# Patient Record
Sex: Female | Born: 1955 | Race: White | Hispanic: No | Marital: Married | Smoking: Never smoker
Health system: Southern US, Community
[De-identification: ages and names within clinical notes are randomized; demographics above are authoritative.]

## PROBLEM LIST (undated history)

## (undated) DIAGNOSIS — J45909 Unspecified asthma, uncomplicated: Secondary | ICD-10-CM

## (undated) HISTORY — PX: VESICOVAGINAL FISTULA CLOSURE W/ TAH: SUR271

## (undated) HISTORY — DX: Unspecified asthma, uncomplicated: J45.909

---

## 2014-03-27 ENCOUNTER — Ambulatory Visit (INDEPENDENT_AMBULATORY_CARE_PROVIDER_SITE_OTHER): Payer: BLUE CROSS/BLUE SHIELD | Admitting: Internal Medicine

## 2014-03-27 ENCOUNTER — Ambulatory Visit (INDEPENDENT_AMBULATORY_CARE_PROVIDER_SITE_OTHER)
Admission: RE | Admit: 2014-03-27 | Discharge: 2014-03-27 | Disposition: A | Discharge status: Home / Self Care | Payer: BLUE CROSS/BLUE SHIELD | Source: Ambulatory Visit | Attending: Internal Medicine | Admitting: Internal Medicine

## 2014-03-27 ENCOUNTER — Encounter (INDEPENDENT_AMBULATORY_CARE_PROVIDER_SITE_OTHER): Payer: Self-pay

## 2014-03-27 ENCOUNTER — Encounter: Payer: Self-pay | Admitting: Internal Medicine

## 2014-03-27 ENCOUNTER — Other Ambulatory Visit (INDEPENDENT_AMBULATORY_CARE_PROVIDER_SITE_OTHER): Payer: BLUE CROSS/BLUE SHIELD

## 2014-03-27 VITALS — BP 130/90 | HR 85 | Ht 63.0 in | Wt 193.0 lb

## 2014-03-27 DIAGNOSIS — R058 Other specified cough: Secondary | ICD-10-CM | POA: Insufficient documentation

## 2014-03-27 DIAGNOSIS — R05 Cough: Secondary | ICD-10-CM

## 2014-03-27 LAB — CBC WITH DIFFERENTIAL/PLATELET
Basophils Absolute: 0 10*3/uL (ref 0.0–0.1)
Basophils Relative: 0.4 % (ref 0.0–3.0)
Eosinophils Absolute: 0.3 10*3/uL (ref 0.0–0.7)
Eosinophils Relative: 2.7 % (ref 0.0–5.0)
HCT: 41 % (ref 36.0–46.0)
Hemoglobin: 13.6 g/dL (ref 12.0–15.0)
LYMPHS PCT: 14.5 % (ref 12.0–46.0)
Lymphs Abs: 1.4 10*3/uL (ref 0.7–4.0)
MCHC: 33.3 g/dL (ref 30.0–36.0)
MCV: 89.4 fl (ref 78.0–100.0)
Monocytes Absolute: 0.6 10*3/uL (ref 0.1–1.0)
Monocytes Relative: 5.9 % (ref 3.0–12.0)
NEUTROS ABS: 7.3 10*3/uL (ref 1.4–7.7)
NEUTROS PCT: 76.5 % (ref 43.0–77.0)
PLATELETS: 377 10*3/uL (ref 150.0–400.0)
RBC: 4.58 Mil/uL (ref 3.87–5.11)
RDW: 15.6 % — AB (ref 11.5–15.5)
WBC: 9.6 10*3/uL (ref 4.0–10.5)

## 2014-03-27 MED ORDER — TRAMADOL HCL 50 MG PO TABS: ORAL_TABLET | ORAL | Status: DC

## 2014-03-27 MED ORDER — PREDNISONE 10 MG PO TABS: ORAL_TABLET | ORAL | Status: DC

## 2014-03-27 MED ORDER — LEVOFLOXACIN 500 MG PO TABS: 500.0000 mg | ORAL_TABLET | Freq: Every day | ORAL | Status: DC

## 2014-03-27 NOTE — Patient Instructions (Addendum)
Please remember to go to the lab and x-ray department downstairs for your tests - we will call you with the results when they are available.    Only use your albuterol(proair)  as a rescue medication to be used if you can't catch your breath by resting or doing a relaxed purse lip breathing pattern.  - The less you use it, the better it will work when you need it. - Ok to use up to 2 puffs  every 4 hours if you must but call for immediate appointment if use goes up over your usual need - Don't leave home without it !!  (think of it like the spare tire for your car)   Levaquin 500 mg x 7days should get rid of the green mucus  Prednisone 10 mg take  4 each am x 2 days,   2 each am x 2 days,  1 each am x 2 days and stop   Please see patient coordinator before you leave today  to schedule sinus CT   Try prilosec 20mg  x 2 Take 30- 60 min before your first and last meals of the day   For cough > mucinex 1200 mg every 12 hours as needed> if can't stop coughing take tramadol 50 mg 1 every 4 hours but this may make your drowsy  GERD (REFLUX)  is an extremely common cause of respiratory symptoms, many times with no significant heartburn at all.    It can be treated with medication, but also with lifestyle changes including avoidance of late meals, excessive alcohol, smoking cessation, and avoid fatty foods, chocolate, peppermint, colas, red wine, and acidic juices such as orange juice.  NO MINT OR MENTHOL PRODUCTS SO NO COUGH DROPS  USE HARD CANDY INSTEAD (jolley ranchers or Stover's or Lifesavers (all available in sugarless versions) NO OIL BASED VITAMINS - use powdered substitutes.  Please schedule a follow up office visit in 2 weeks, sooner if needed

## 2014-03-27 NOTE — Assessment & Plan Note (Addendum)
The most common causes of chronic cough in immunocompetent adults include the following: upper airway cough syndrome (UACS), previously referred to as postnasal drip syndrome (PNDS), which is caused by variety of rhinosinus conditions; (2) asthma; (3) GERD; (4) chronic bronchitis from cigarette smoking or other inhaled environmental irritants; (5) nonasthmatic eosinophilic bronchitis; and (6) bronchiectasis.   These conditions, singly or in combination, have accounted for up to 94% of the causes of chronic cough in prospective studies.   Other conditions have constituted no >6% of the causes in prospective studies These have included bronchogenic carcinoma, chronic interstitial pneumonia, sarcoidosis, left ventricular failure, ACEI-induced cough, and aspiration from a condition associated with pharyngeal dysfunction.    Chronic cough is often simultaneously caused by more than one condition. A single cause has been found from 38 to 82% of the time, multiple causes from 18 to 62%. Multiply caused cough has been the result of three diseases up to 42% of the time.       Based on hx and exam, this is most likely:  Classic Upper airway cough syndrome, so named because it's frequently impossible to sort out how much is  CR/sinusitis with freq throat clearing (which can be related to primary GERD)   vs  causing  secondary (" extra esophageal")  GERD from wide swings in gastric pressure that occur with throat clearing, often  promoting self use of mint and menthol lozenges that reduce the lower esophageal sphincter tone and exacerbate the problem further in a cyclical fashion.   These are the same pts (now being labeled as having "irritable larynx syndrome" by some cough centers) who not infrequently have a history of having failed to tolerate ace inhibitors,  dry powder ICS(or even as in this case sometimes worse on hfa ICS) or biphosphonates or report having atypical reflux symptoms that don't respond to  standard doses of PPI , and are easily confused as having aecopd or asthma flares by even experienced allergists/ pulmonologists.   The first step is to maximize acid suppression and eliminate cyclical coughing/ stop all ics for now  And evaluate for sinus/allergic dz as possible underlying triggers  See instructions for specific recommendations which were reviewed directly with the patient who was given a copy with highlighter outlining the key components. .Marland Kitchen

## 2014-03-27 NOTE — Progress Notes (Signed)
Subjective:    Patient ID: Cheryl Ross, female    DOB: 1955-08-18,    MRN: [id number]  HPI  [age] yowf with ulcerative colitis  never smoker with "seasonal bronchitis" as child worse in middle school and high school esp Sep and April eval by Dr Jethro BolusGene [place] with allergies to pollen/dust/ grass/ dogs and no response to shots no maintenance rx just prn proair then starting Oct 2015 acute onset severe sore throat, head/ chest congestion / cough/ wheeze better maybe 75% while on prednisone and abx and relapse off so referred 03/27/2014 by Dr Abner GreenspanBeth Hodges to pulmonary clinic.   03/27/2014 [facility] office visit/ [facility]   Chief Complaint  Patient presents with  . Pulmonary Consult    Referred by Dr. Abner GreenspanBeth Hodges.  Pt states that she was dxed with asthma several years ago.  She c/o increased SOB and cough for the past 3 months. She is coughing up moderate green sputum.  She is mainly SOB with exertion "at a fairly brisk walk" but occ gets SOB at rest.   last abx Feb 03 2014 / last pred also then.  Present pattern of daily cough x 4 m>. Cough worst p stirs in am / and hs but not while sleeping or waking up prematurely No inhalers for sev days and no worse off them  but still taking singulair q hs since the fall of 2015 ? Helping (not sure ) Sore throat is gone now , no fever.  No obvious day to day or daytime variabilty or assoc cp or chest tightness, subjective wheeze overt sinus or hb symptoms. No unusual exp hx or h/o childhood pna/   knowledge of premature birth.  Sleeping ok without nocturnal  or early am exacerbation  of respiratory  c/o's or need for noct saba. Also denies any obvious fluctuation of symptoms with weather or environmental changes or other aggravating or alleviating factors except as outlined above   Current Medications, Allergies, Complete Past Medical History, Past Surgical History, Family History, and Social History were reviewed in [facility] electronic medical  record.       Review of Systems  Constitutional: Positive for appetite change. Negative for fever, chills and unexpected weight change.  HENT: Positive for congestion and sore throat. Negative for dental problem, ear pain, nosebleeds, postnasal drip, rhinorrhea, sinus pressure, sneezing, trouble swallowing and voice change.   Eyes: Negative for visual disturbance.  Respiratory: Positive for cough and shortness of breath. Negative for choking.   Cardiovascular: Negative for chest pain and leg swelling.  Gastrointestinal: Negative for vomiting, abdominal pain and diarrhea.  Genitourinary: Negative for difficulty urinating.  Musculoskeletal: Positive for arthralgias.  Skin: Negative for rash.  Neurological: Positive for headaches. Negative for tremors and syncope.  Hematological: Does not bruise/bleed easily.       Objective:   Physical Exam   amb wf with somewhat of a hopeless affect "I can't get rid of anything   . Wt Readings from Last 3 Encounters:  03/27/14 193 lb (87.544 kg)    Vital signs reviewed   .HEENT: nl dentition, turbinates, and orophanx. Nl external ear canals without cough reflex   NECK :  without JVD/Nodes/TM/ nl carotid upstrokes bilaterally   LUNGS: no acc muscle use, clear to A and P bilaterally without cough on insp or exp maneuvers   CV:  RRR  no s3 or murmur or increase in P2, no edema   ABD:  soft and nontender with nl excursion in  the supine position. No bruits or organomegaly, bowel sounds nl  MS:  warm without deformities, calf tenderness, cyanosis or clubbing  SKIN: warm and dry without lesions    NEURO:  alert, approp, no deficits      CXR PA and Lateral:   03/27/2014 :     I personally reviewed images and agree with radiology impression as follows:    no hyperinflation, minimal atx bases bilaterally        Assessment & Plan:

## 2014-03-28 LAB — ALLERGY FULL PROFILE
Allergen, D pternoyssinus,d7: <0.1 kU/L
Allergen,Goose feathers, e70: <0.1 kU/L
Alternaria Alternata: <0.1 kU/L
Aspergillus fumigatus, m3: <0.1 kU/L
Bermuda Grass: <0.1 kU/L
Candida Albicans: <0.1 kU/L
Common Ragweed: <0.1 kU/L
D. farinae: <0.1 kU/L
Dog Dander: <0.1 kU/L
G005 Rye, Perennial: <0.1 kU/L
Helminthosporium halodes: <0.1 kU/L
House Dust Hollister: <0.1 kU/L
IgE (Immunoglobulin E), Serum: 8 kU/L (ref <115)
Oak: <0.1 kU/L
Plantain: <0.1 kU/L
Stemphylium Botryosum: <0.1 kU/L
Sycamore Tree: <0.1 kU/L

## 2014-03-29 NOTE — Progress Notes (Signed)
Quick Note:  LMTCB ______ 

## 2014-03-30 ENCOUNTER — Telehealth: Payer: Self-pay | Admitting: Internal Medicine

## 2014-03-30 NOTE — Telephone Encounter (Signed)
Notes Recorded by Nyoka CowdenMichael B Wert, MD on 03/28/2014 at 3:46 PM Call patient : Studies are unremarkable, neg for resp allergens -- Pt is aware of results.

## 2014-04-04 ENCOUNTER — Inpatient Hospital Stay: Admission: RE | Admit: 2014-04-04 | Payer: BLUE CROSS/BLUE SHIELD | Source: Ambulatory Visit

## 2014-04-05 ENCOUNTER — Ambulatory Visit (INDEPENDENT_AMBULATORY_CARE_PROVIDER_SITE_OTHER)
Admission: RE | Admit: 2014-04-05 | Discharge: 2014-04-05 | Disposition: A | Discharge status: Home / Self Care | Payer: BLUE CROSS/BLUE SHIELD | Source: Ambulatory Visit | Attending: Internal Medicine | Admitting: Internal Medicine

## 2014-04-05 DIAGNOSIS — R058 Other specified cough: Secondary | ICD-10-CM

## 2014-04-05 DIAGNOSIS — R05 Cough: Secondary | ICD-10-CM

## 2014-04-06 ENCOUNTER — Telehealth: Payer: Self-pay | Admitting: Internal Medicine

## 2014-04-06 NOTE — Telephone Encounter (Signed)
Notes Recorded by Nyoka CowdenMichael B Wert, MD on 04/06/2014 at 1:45 PM Call patient : Study is c/w sinsusitis which is probably contributing to cough rec levaquin 500 x 10 days then ov  LMTCB

## 2014-04-06 NOTE — Progress Notes (Signed)
Quick Note:  LMTCB on home and cell numbers ______ 

## 2014-04-07 NOTE — Telephone Encounter (Signed)
Called and spoke to pt. Informed pt of the recs per MW. Pt stated she will finish the abx on Sunday 3/6. Advised pt to keep appt on Monday 3/7. Pt verbalized understanding and denied any further questions or concerns at this time.

## 2014-04-07 NOTE — Telephone Encounter (Signed)
Sorry to confuse = be sure she takes a full 10 days of levaquin prior to the ov so ideally we need to see her on day 10 to then decide how she's responded and what to do next

## 2014-04-07 NOTE — Telephone Encounter (Signed)
Patient says she is already taking Levaquin 500mg  and scheduled to follow up on Monday 04/10/14.  Did you want her to take another round of Levaquin and come back 10 days later?  Did you want her to cancel appt for Monday?  Please advise.

## 2014-04-07 NOTE — Telephone Encounter (Signed)
lmtcb x2 

## 2014-04-07 NOTE — Telephone Encounter (Signed)
Pt returned call. States you can leave vm - [phone]

## 2014-04-10 ENCOUNTER — Encounter: Payer: Self-pay | Admitting: Internal Medicine

## 2014-04-10 ENCOUNTER — Ambulatory Visit (INDEPENDENT_AMBULATORY_CARE_PROVIDER_SITE_OTHER): Payer: BLUE CROSS/BLUE SHIELD | Admitting: Internal Medicine

## 2014-04-10 DIAGNOSIS — R05 Cough: Secondary | ICD-10-CM

## 2014-04-10 DIAGNOSIS — R058 Other specified cough: Secondary | ICD-10-CM

## 2014-04-10 MED ORDER — MONTELUKAST SODIUM 10 MG PO TABS: 10.0000 mg | ORAL_TABLET | Freq: Every day | ORAL | Status: AC

## 2014-04-10 MED ORDER — LEVOFLOXACIN 500 MG PO TABS: 500.0000 mg | ORAL_TABLET | Freq: Every day | ORAL | Status: AC

## 2014-04-10 NOTE — Patient Instructions (Signed)
Levaquin 500 mg x 10 days then sinus CT at 10 days  Resume singulair  Continue prilosec and diet as you are   We will call with results

## 2014-04-10 NOTE — Progress Notes (Signed)
Subjective:    Patient ID: Cheryl Ross, female    DOB: 20-Apr-1955,    MRN: [id number]  HPI  [age] yowf with ulcerative colitis  never smoker with "seasonal bronchitis" as child worse in middle school and high school esp Sep and April eval by Dr Jethro BolusGene [employer] with allergies to pollen/dust/ grass/ dogs and no response to shots no maintenance rx just prn proair then starting Oct 2015 acute onset severe sore throat, head/ chest congestion / cough/ wheeze better maybe 75% while on prednisone and abx and relapse off so referred 03/27/2014 by Dr Abner GreenspanBeth Hodges to pulmonary clinic.   03/27/2014 [facility] office visit/ [facility]   Chief Complaint  Patient presents with  . Pulmonary Consult    Referred by Dr. Abner GreenspanBeth Hodges.  Pt states that she was dxed with asthma several years ago.  She c/o increased SOB and cough for the past 3 months. She is coughing up moderate green sputum.  She is mainly SOB with exertion "at a fairly brisk walk" but occ gets SOB at rest.   last abx Feb 03 2014 / last pred also then.  Present pattern of daily cough x 4 m>. Cough worst p stirs in am / and hs but not while sleeping or waking up prematurely No inhalers for sev days and no worse off them  but still taking singulair q hs since the fall of 2015 ? Helping (not sure ) Sore throat is gone now , no fever. rec Please remember to go to the lab and x-ray department downstairs for your tests - we will call you with the results when they are available.  Only use your albuterol(proair)  as a rescue medication  Levaquin 500 mg x 7days should get rid of the green mucus Prednisone 10 mg take  4 each am x 2 days,   2 each am x 2 days,  1 each am x 2 days and stop  Please see patient coordinator before you leave today  to schedule sinus CT > positive R max sinusitis > rx levaquin x 10 days Try prilosec 20mg  x 2 Take 30- 60 min before your first and last meals of the day  For cough > mucinex 1200 mg every 12 hours as needed> if can't  stop coughing take tramadol 50 mg 1 every 4 hours but this may make your drowsy   04/10/2014 f/u ov/[facility] re: acute sinusitis/ completed levaquin x 10 days on 04/09/14 and stopped singulair on her own  Chief Complaint  Patient presents with  . Follow-up    Pt states that her cough and SOB are some better, but not resolved. She states that sputum is now clear. She finished levaquin on 04/09/14.   cough is sporadic, maybe occ worse in am's never used tramadol and no perceived need for saba  No obvious day to day or daytime variabilty or assoc  cp or chest tightness, subjective wheeze overt sinus or hb symptoms. No unusual exp hx or h/o childhood pna/ asthma or knowledge of premature birth.  Sleeping ok without nocturnal  or early am exacerbation  of respiratory  c/o's or need for noct saba. Also denies any obvious fluctuation of symptoms with weather or environmental changes or other aggravating or alleviating factors except as outlined above   Current Medications, Allergies, Complete Past Medical History, Past Surgical History, Family History, and Social History were reviewed in [facility] electronic medical record.  ROS  The following are not active complaints unless  bolded sore throat, dysphagia, dental problems, itching, sneezing,  nasal congestion or excess/ purulent secretions, ear ache,   fever, chills, sweats, unintended wt loss, pleuritic or exertional cp, hemoptysis,  orthopnea pnd or leg swelling, presyncope, palpitations, heartburn, abdominal pain, anorexia, nausea, vomiting, diarrhea  or change in bowel or urinary habits, change in stools or urine, dysuria,hematuria,  rash, arthralgias, visual complaints, headache, numbness weakness or ataxia or problems with walking or coordination,  change in mood/affect or memory.         Objective:   Physical Exam   amb wf with somewhat of a hopeless affect     Wt Readings from Last 3 Encounters:  04/10/14 194 lb (87.998 kg)  03/27/14 193 lb  (87.544 kg)    Vital signs reviewed      .HEENT: nl dentition, turbinates, and orophanx. Nl external ear canals without cough reflex   NECK :  without JVD/Nodes/TM/ nl carotid upstrokes bilaterally   LUNGS: no acc muscle use, clear to A and P bilaterally without cough on insp or exp maneuvers   CV:  RRR  no s3 or murmur or increase in P2, no edema   ABD:  soft and nontender with nl excursion in the supine position. No bruits or organomegaly, bowel sounds nl  MS:  warm without deformities, calf tenderness, cyanosis or clubbing  SKIN: warm and dry without lesions    NEURO:  alert, approp, no deficits      CXR PA and Lateral:   03/27/2014 :     I personally reviewed images and agree with radiology impression as follows:    no hyperinflation, minimal atx bases bilaterally        Assessment & Plan:

## 2014-04-10 NOTE — Assessment & Plan Note (Addendum)
-   Allergy profile 03/27/2014 >  IgE 8 neg RAST - Sinus CT 04/05/14 > Small air-fluid level present in the right maxillary antrum. Mild mucosal thickening present in sphenoid air cells with small air-fluid levels identified > rec levaquin 500 x 10 days> improved but not resolved    I had an extended discussion with the patient reviewing all relevant studies completed to date and  lasting 15 to 20 minutes of a 25 minute visit on the following ongoing concerns:  1) chronic sinusitis may or may not explain all her symptoms but clearly better p 10 days rx so rec 10 more then sinus ct  2) if not clear sinus ct then next step ent  3) if clear sinus ct but still coughing add 1st gen h1 and proceed with MCT next   4) resume singulair until we sort out the cause of the cough  5) Each maintenance medication was reviewed in detail including most importantly the difference between maintenance and as needed and under what circumstances the prns are to be used.  Please see instructions for details which were reviewed in writing and the patient given a copy.

## 2014-04-21 ENCOUNTER — Ambulatory Visit (INDEPENDENT_AMBULATORY_CARE_PROVIDER_SITE_OTHER)
Admission: RE | Admit: 2014-04-21 | Discharge: 2014-04-21 | Disposition: A | Discharge status: Home / Self Care | Payer: BC Managed Care – PPO | Source: Ambulatory Visit | Attending: Internal Medicine | Admitting: Internal Medicine

## 2014-04-21 DIAGNOSIS — R05 Cough: Secondary | ICD-10-CM

## 2014-04-21 DIAGNOSIS — R058 Other specified cough: Secondary | ICD-10-CM

## 2014-04-26 NOTE — Progress Notes (Signed)
Quick Note:  LMTCB ______ 

## 2014-04-27 ENCOUNTER — Telehealth: Payer: Self-pay | Admitting: Internal Medicine

## 2014-04-27 NOTE — Telephone Encounter (Signed)
Notes Recorded by Nyoka CowdenMichael B Wert, MD on 04/22/2014 at 2:05 PM Call patient : Study is improved with only minimal residual fluid > if feeling better no more abx, if not may benefit from ent eval LMOM per her request  Advised her to call back if needed

## 2014-05-02 NOTE — Progress Notes (Signed)
Quick Note:  lmtcb for pt. ______ 

## 2016-05-01 IMAGING — CT CT PARANASAL SINUSES LIMITED
1 of 2 series · 16 of 19 positions shown, 20 images · non-contrast
Comparison: None.

CLINICAL DATA: Productive cough, nasal congestion and back for
sinus pressure.

EXAM:
CT PARANASAL SINUS LIMITED WITHOUT CONTRAST
TECHNIQUE: Non-contiguous multidetector CT images of the paranasal sinuses were
obtained in a single plane without contrast.

[Series 4: ltd sinus 3.0 h30s · axial · 0.33mm/px · z∈[-69,+26]mm · 16 of 18 slices shown, 20 images]
[im 2/18  brain]
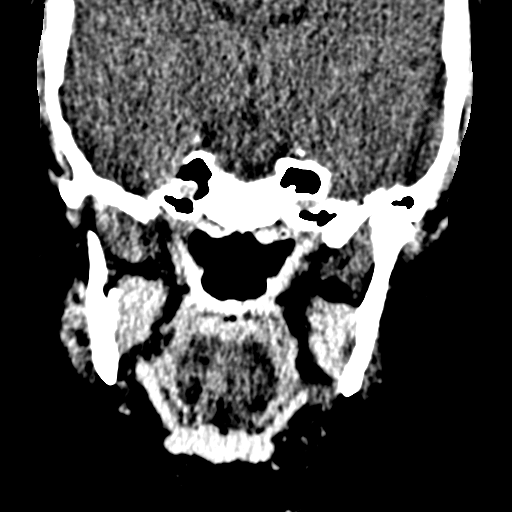
[im 2/18  bone]
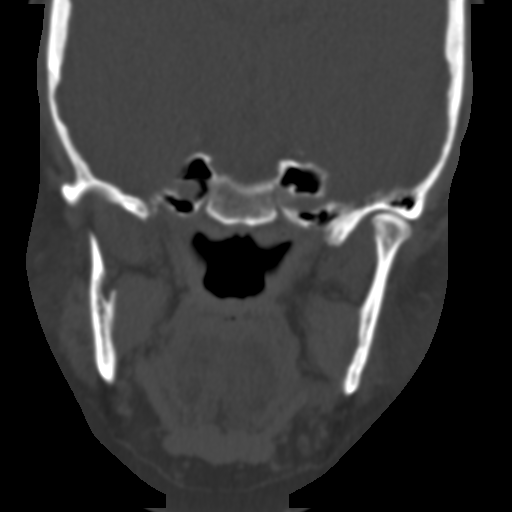
[im 3/18  bone]
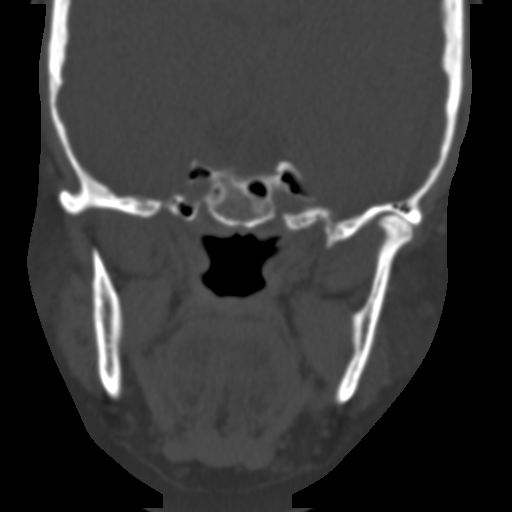
[im 4/18  bone]
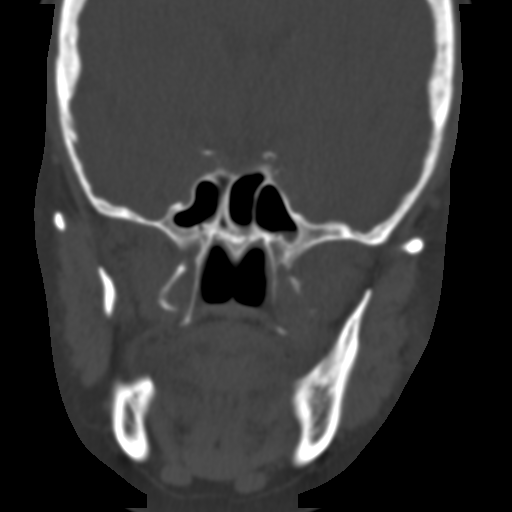
[im 5/18  bone]
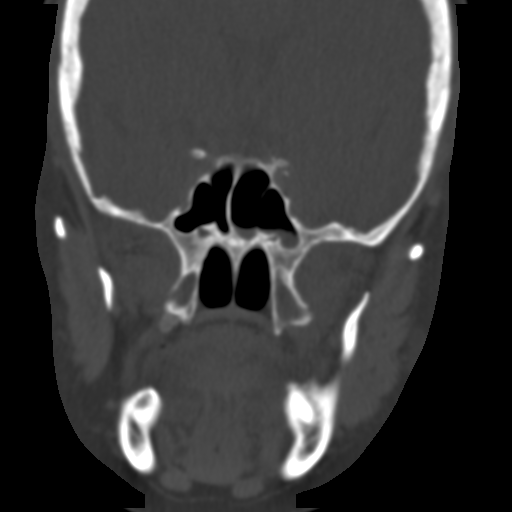
[im 6/18  brain]
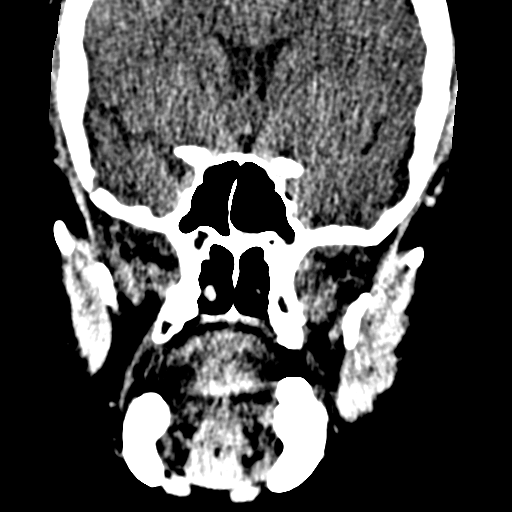
[im 6/18  bone]
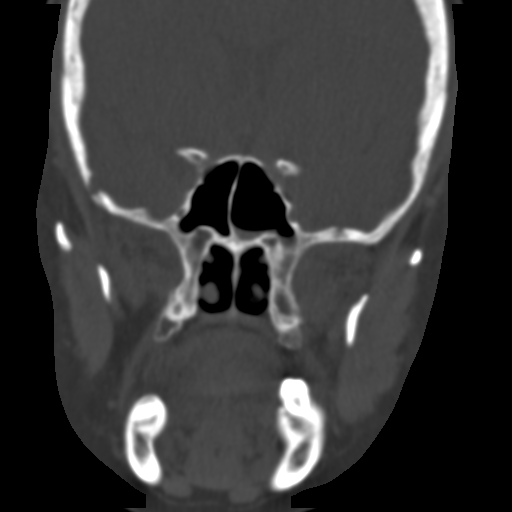
[im 7/18  bone]
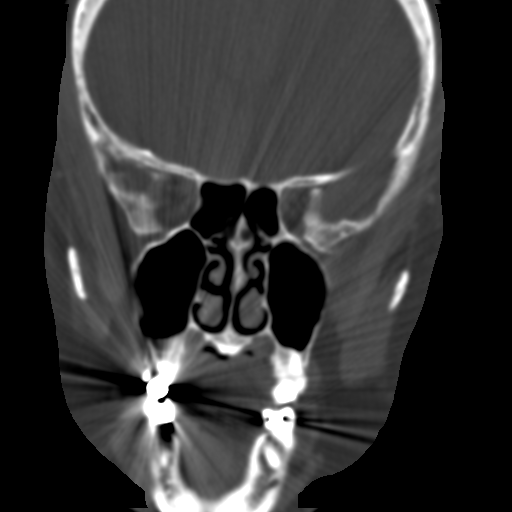
[im 8/18  bone]
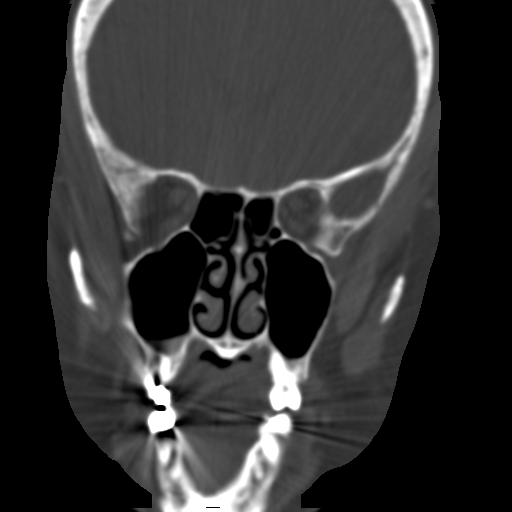
[im 9/18  bone]
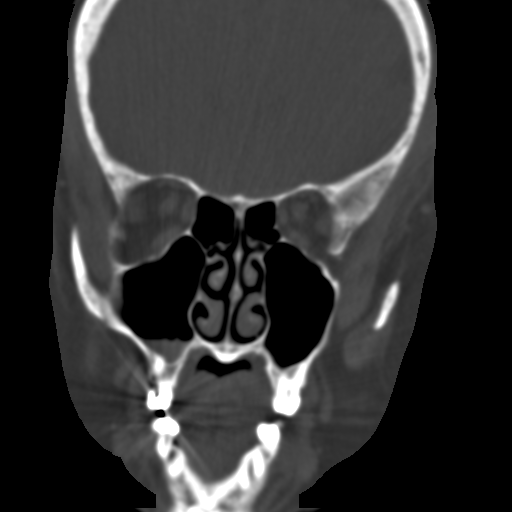
[im 10/18  brain]
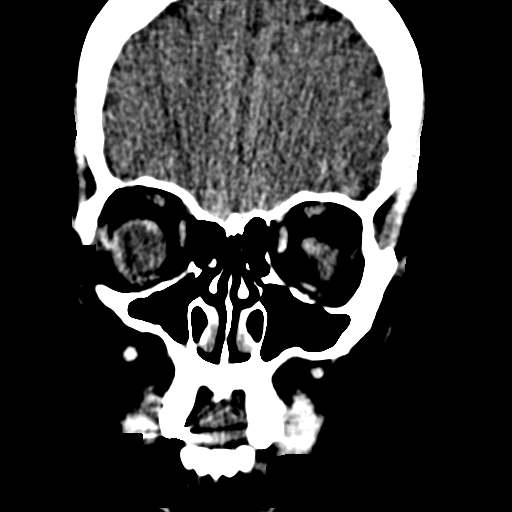
[im 10/18  bone]
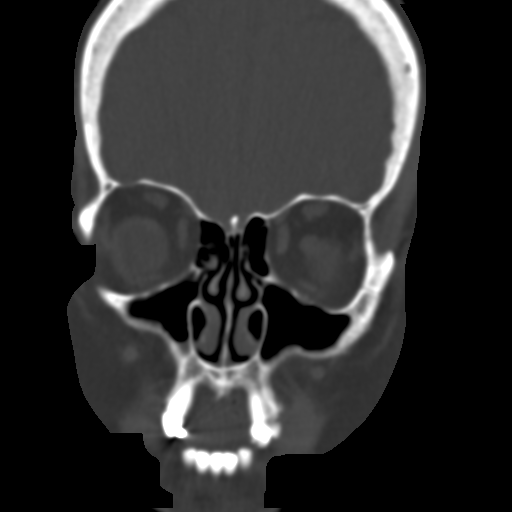
[im 11/18  bone]
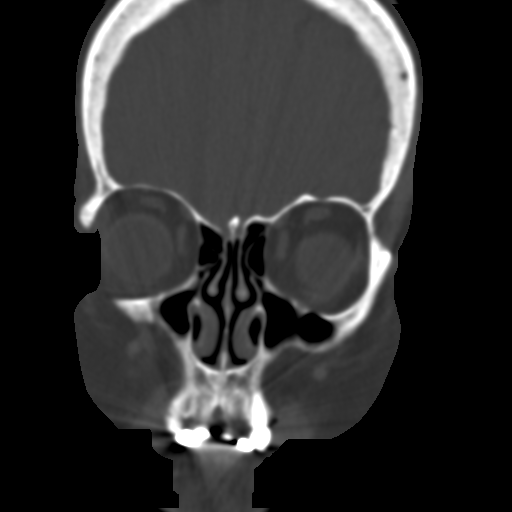
[im 12/18  bone]
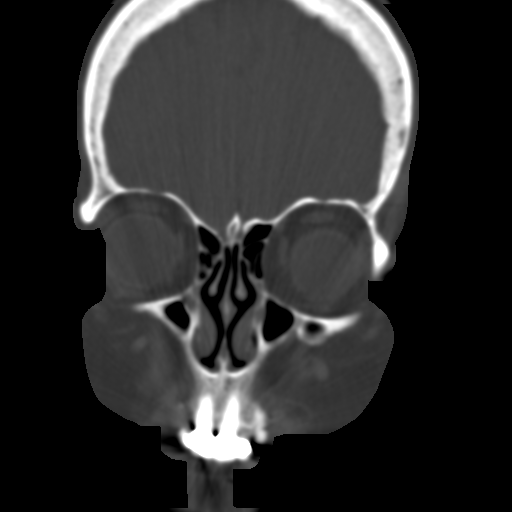
[im 13/18  bone]
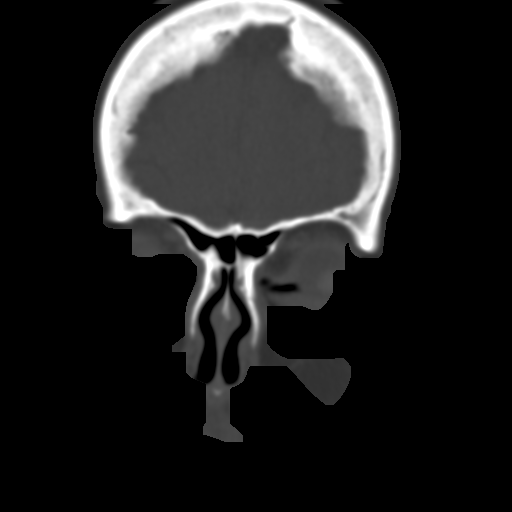
[im 14/18  brain]
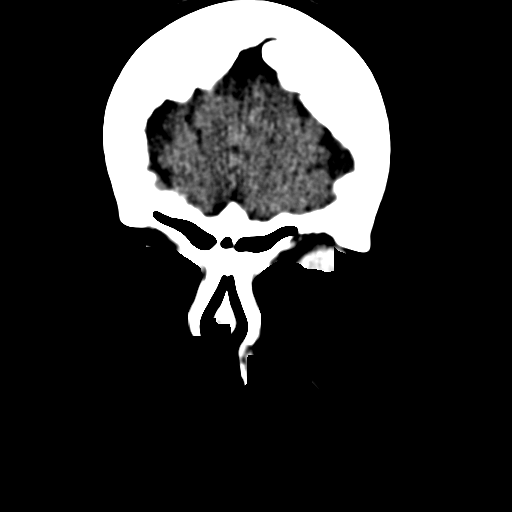
[im 14/18  bone]
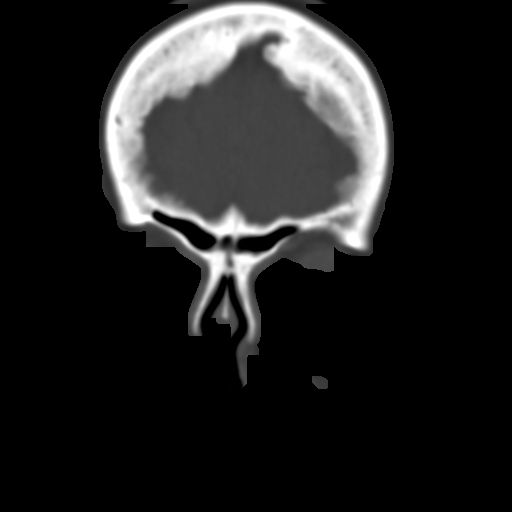
[im 15/18  bone]
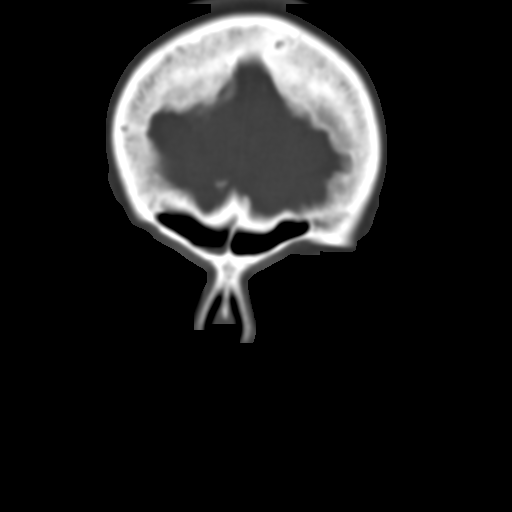
[im 16/18  bone]
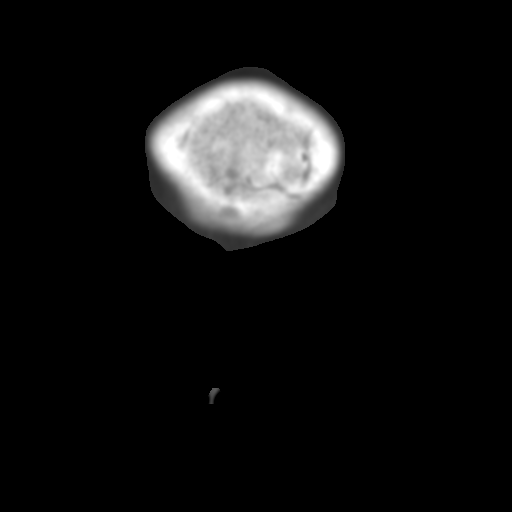
[im 17/18  bone]
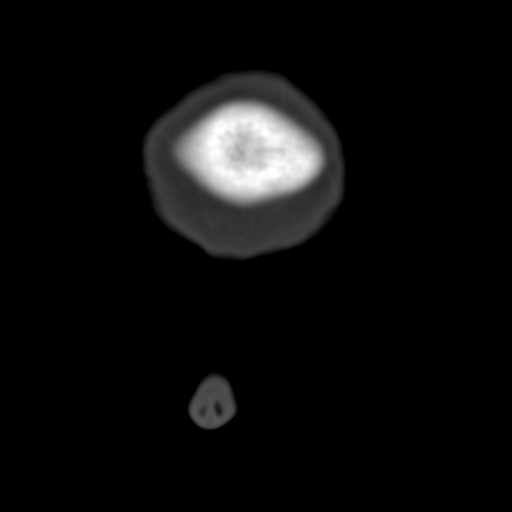

[16 of 19 positions shown; findings below may reference images not displayed]

FINDINGS: Small air-fluid level present in the right maxillary antrum. Mild
mucosal thickening present in sphenoid air cells with small
air-fluid levels identified. Visualized frontal air cells show mild
mucosal thickening on the right. Visualized ethmoid air cells show
normal aeration. The nasal septum is in the midline.
IMPRESSION: Relatively mild sinus disease and sinusitis of the right maxillary
antrum, sphenoid air cells and right frontal air cell.

## 2021-02-15 DIAGNOSIS — R82998 Other abnormal findings in urine: Secondary | ICD-10-CM | POA: Diagnosis not present

## 2021-04-08 DIAGNOSIS — K219 Gastro-esophageal reflux disease without esophagitis: Secondary | ICD-10-CM | POA: Diagnosis not present

## 2021-04-08 DIAGNOSIS — K5190 Ulcerative colitis, unspecified, without complications: Secondary | ICD-10-CM | POA: Diagnosis not present

## 2021-04-08 DIAGNOSIS — R1310 Dysphagia, unspecified: Secondary | ICD-10-CM | POA: Diagnosis not present

## 2021-04-18 DIAGNOSIS — D649 Anemia, unspecified: Secondary | ICD-10-CM | POA: Diagnosis not present

## 2021-04-18 DIAGNOSIS — K5190 Ulcerative colitis, unspecified, without complications: Secondary | ICD-10-CM | POA: Diagnosis not present

## 2021-05-03 DIAGNOSIS — K635 Polyp of colon: Secondary | ICD-10-CM | POA: Diagnosis not present

## 2021-05-03 DIAGNOSIS — K449 Diaphragmatic hernia without obstruction or gangrene: Secondary | ICD-10-CM | POA: Diagnosis not present

## 2021-05-03 DIAGNOSIS — J45909 Unspecified asthma, uncomplicated: Secondary | ICD-10-CM | POA: Diagnosis not present

## 2021-05-03 DIAGNOSIS — K219 Gastro-esophageal reflux disease without esophagitis: Secondary | ICD-10-CM | POA: Diagnosis not present

## 2021-05-03 DIAGNOSIS — K222 Esophageal obstruction: Secondary | ICD-10-CM | POA: Diagnosis not present

## 2021-05-03 DIAGNOSIS — K5190 Ulcerative colitis, unspecified, without complications: Secondary | ICD-10-CM | POA: Diagnosis not present

## 2021-05-03 DIAGNOSIS — R1310 Dysphagia, unspecified: Secondary | ICD-10-CM | POA: Diagnosis not present

## 2021-05-03 DIAGNOSIS — D126 Benign neoplasm of colon, unspecified: Secondary | ICD-10-CM | POA: Diagnosis not present

## 2021-05-20 DIAGNOSIS — N390 Urinary tract infection, site not specified: Secondary | ICD-10-CM | POA: Diagnosis not present

## 2021-05-20 DIAGNOSIS — R82998 Other abnormal findings in urine: Secondary | ICD-10-CM | POA: Diagnosis not present

## 2021-07-15 DIAGNOSIS — M25561 Pain in right knee: Secondary | ICD-10-CM | POA: Diagnosis not present

## 2021-07-30 DIAGNOSIS — Z136 Encounter for screening for cardiovascular disorders: Secondary | ICD-10-CM | POA: Diagnosis not present

## 2021-07-30 DIAGNOSIS — Z1331 Encounter for screening for depression: Secondary | ICD-10-CM | POA: Diagnosis not present

## 2021-07-30 DIAGNOSIS — Z1339 Encounter for screening examination for other mental health and behavioral disorders: Secondary | ICD-10-CM | POA: Diagnosis not present

## 2021-07-30 DIAGNOSIS — Z789 Other specified health status: Secondary | ICD-10-CM | POA: Diagnosis not present

## 2021-07-30 DIAGNOSIS — Z1231 Encounter for screening mammogram for malignant neoplasm of breast: Secondary | ICD-10-CM | POA: Diagnosis not present

## 2021-07-30 DIAGNOSIS — Z0189 Encounter for other specified special examinations: Secondary | ICD-10-CM | POA: Diagnosis not present

## 2021-07-30 DIAGNOSIS — Z1389 Encounter for screening for other disorder: Secondary | ICD-10-CM | POA: Diagnosis not present

## 2021-07-30 DIAGNOSIS — Z0000 Encounter for general adult medical examination without abnormal findings: Secondary | ICD-10-CM | POA: Diagnosis not present

## 2021-07-30 DIAGNOSIS — Z139 Encounter for screening, unspecified: Secondary | ICD-10-CM | POA: Diagnosis not present

## 2021-07-31 DIAGNOSIS — D2239 Melanocytic nevi of other parts of face: Secondary | ICD-10-CM | POA: Diagnosis not present

## 2021-07-31 DIAGNOSIS — L821 Other seborrheic keratosis: Secondary | ICD-10-CM | POA: Diagnosis not present

## 2021-07-31 DIAGNOSIS — D485 Neoplasm of uncertain behavior of skin: Secondary | ICD-10-CM | POA: Diagnosis not present

## 2021-08-03 DIAGNOSIS — K5100 Ulcerative (chronic) pancolitis without complications: Secondary | ICD-10-CM | POA: Diagnosis not present

## 2021-08-03 DIAGNOSIS — J4541 Moderate persistent asthma with (acute) exacerbation: Secondary | ICD-10-CM | POA: Diagnosis not present

## 2021-08-07 DIAGNOSIS — Z20822 Contact with and (suspected) exposure to covid-19: Secondary | ICD-10-CM | POA: Diagnosis not present

## 2021-08-07 DIAGNOSIS — J4541 Moderate persistent asthma with (acute) exacerbation: Secondary | ICD-10-CM | POA: Diagnosis not present

## 2021-08-07 DIAGNOSIS — Z1152 Encounter for screening for COVID-19: Secondary | ICD-10-CM | POA: Diagnosis not present

## 2021-08-15 DIAGNOSIS — Z13220 Encounter for screening for lipoid disorders: Secondary | ICD-10-CM | POA: Diagnosis not present

## 2021-08-15 DIAGNOSIS — I10 Essential (primary) hypertension: Secondary | ICD-10-CM | POA: Diagnosis not present

## 2021-08-15 DIAGNOSIS — Z131 Encounter for screening for diabetes mellitus: Secondary | ICD-10-CM | POA: Diagnosis not present

## 2021-08-19 DIAGNOSIS — D84821 Immunodeficiency due to drugs: Secondary | ICD-10-CM | POA: Diagnosis not present

## 2021-08-19 DIAGNOSIS — R059 Cough, unspecified: Secondary | ICD-10-CM | POA: Diagnosis not present

## 2021-08-19 DIAGNOSIS — R051 Acute cough: Secondary | ICD-10-CM | POA: Diagnosis not present

## 2021-08-19 DIAGNOSIS — K5100 Ulcerative (chronic) pancolitis without complications: Secondary | ICD-10-CM | POA: Diagnosis not present

## 2021-08-19 DIAGNOSIS — R918 Other nonspecific abnormal finding of lung field: Secondary | ICD-10-CM | POA: Diagnosis not present

## 2021-08-19 DIAGNOSIS — R509 Fever, unspecified: Secondary | ICD-10-CM | POA: Diagnosis not present

## 2021-08-19 DIAGNOSIS — Z20822 Contact with and (suspected) exposure to covid-19: Secondary | ICD-10-CM | POA: Diagnosis not present

## 2021-08-26 DIAGNOSIS — J4541 Moderate persistent asthma with (acute) exacerbation: Secondary | ICD-10-CM | POA: Diagnosis not present

## 2021-08-26 DIAGNOSIS — Z6832 Body mass index (BMI) 32.0-32.9, adult: Secondary | ICD-10-CM | POA: Diagnosis not present

## 2021-08-28 DIAGNOSIS — M1711 Unilateral primary osteoarthritis, right knee: Secondary | ICD-10-CM | POA: Diagnosis not present

## 2021-08-28 DIAGNOSIS — M25561 Pain in right knee: Secondary | ICD-10-CM | POA: Diagnosis not present

## 2021-09-03 DIAGNOSIS — J4541 Moderate persistent asthma with (acute) exacerbation: Secondary | ICD-10-CM | POA: Diagnosis not present

## 2021-09-03 DIAGNOSIS — K5100 Ulcerative (chronic) pancolitis without complications: Secondary | ICD-10-CM | POA: Diagnosis not present

## 2021-09-13 DIAGNOSIS — M8589 Other specified disorders of bone density and structure, multiple sites: Secondary | ICD-10-CM | POA: Diagnosis not present

## 2021-09-13 DIAGNOSIS — Z1231 Encounter for screening mammogram for malignant neoplasm of breast: Secondary | ICD-10-CM | POA: Diagnosis not present

## 2021-09-13 DIAGNOSIS — E2839 Other primary ovarian failure: Secondary | ICD-10-CM | POA: Diagnosis not present

## 2021-10-04 DIAGNOSIS — K5100 Ulcerative (chronic) pancolitis without complications: Secondary | ICD-10-CM | POA: Diagnosis not present

## 2021-10-04 DIAGNOSIS — J4541 Moderate persistent asthma with (acute) exacerbation: Secondary | ICD-10-CM | POA: Diagnosis not present

## 2021-11-19 DIAGNOSIS — N390 Urinary tract infection, site not specified: Secondary | ICD-10-CM | POA: Diagnosis not present

## 2021-11-19 DIAGNOSIS — N3010 Interstitial cystitis (chronic) without hematuria: Secondary | ICD-10-CM | POA: Diagnosis not present

## 2021-12-30 DIAGNOSIS — I10 Essential (primary) hypertension: Secondary | ICD-10-CM | POA: Diagnosis not present

## 2021-12-30 DIAGNOSIS — Z13220 Encounter for screening for lipoid disorders: Secondary | ICD-10-CM | POA: Diagnosis not present

## 2022-01-03 DIAGNOSIS — J4541 Moderate persistent asthma with (acute) exacerbation: Secondary | ICD-10-CM | POA: Diagnosis not present

## 2022-01-03 DIAGNOSIS — K5100 Ulcerative (chronic) pancolitis without complications: Secondary | ICD-10-CM | POA: Diagnosis not present

## 2022-01-07 DIAGNOSIS — Z6833 Body mass index (BMI) 33.0-33.9, adult: Secondary | ICD-10-CM | POA: Diagnosis not present

## 2022-01-07 DIAGNOSIS — Z23 Encounter for immunization: Secondary | ICD-10-CM | POA: Diagnosis not present

## 2022-01-07 DIAGNOSIS — Z1331 Encounter for screening for depression: Secondary | ICD-10-CM | POA: Diagnosis not present

## 2022-01-07 DIAGNOSIS — Z9181 History of falling: Secondary | ICD-10-CM | POA: Diagnosis not present

## 2022-01-07 DIAGNOSIS — I10 Essential (primary) hypertension: Secondary | ICD-10-CM | POA: Diagnosis not present

## 2022-01-07 DIAGNOSIS — E785 Hyperlipidemia, unspecified: Secondary | ICD-10-CM | POA: Diagnosis not present

## 2022-01-07 DIAGNOSIS — Z1231 Encounter for screening mammogram for malignant neoplasm of breast: Secondary | ICD-10-CM | POA: Diagnosis not present

## 2022-01-08 DIAGNOSIS — M25561 Pain in right knee: Secondary | ICD-10-CM | POA: Diagnosis not present

## 2022-02-03 DIAGNOSIS — J4541 Moderate persistent asthma with (acute) exacerbation: Secondary | ICD-10-CM | POA: Diagnosis not present

## 2022-02-03 DIAGNOSIS — K5100 Ulcerative (chronic) pancolitis without complications: Secondary | ICD-10-CM | POA: Diagnosis not present

## 2022-02-24 DIAGNOSIS — N3010 Interstitial cystitis (chronic) without hematuria: Secondary | ICD-10-CM | POA: Diagnosis not present

## 2022-02-24 DIAGNOSIS — R82998 Other abnormal findings in urine: Secondary | ICD-10-CM | POA: Diagnosis not present

## 2022-02-24 DIAGNOSIS — Z87440 Personal history of urinary (tract) infections: Secondary | ICD-10-CM | POA: Diagnosis not present

## 2022-04-04 DIAGNOSIS — K5100 Ulcerative (chronic) pancolitis without complications: Secondary | ICD-10-CM | POA: Diagnosis not present

## 2022-04-04 DIAGNOSIS — J4541 Moderate persistent asthma with (acute) exacerbation: Secondary | ICD-10-CM | POA: Diagnosis not present

## 2022-05-05 DIAGNOSIS — J4541 Moderate persistent asthma with (acute) exacerbation: Secondary | ICD-10-CM | POA: Diagnosis not present

## 2022-05-05 DIAGNOSIS — K5100 Ulcerative (chronic) pancolitis without complications: Secondary | ICD-10-CM | POA: Diagnosis not present

## 2022-06-02 DIAGNOSIS — E785 Hyperlipidemia, unspecified: Secondary | ICD-10-CM | POA: Diagnosis not present

## 2022-06-02 DIAGNOSIS — Z131 Encounter for screening for diabetes mellitus: Secondary | ICD-10-CM | POA: Diagnosis not present

## 2022-06-19 DIAGNOSIS — Z6830 Body mass index (BMI) 30.0-30.9, adult: Secondary | ICD-10-CM | POA: Diagnosis not present

## 2022-06-19 DIAGNOSIS — E785 Hyperlipidemia, unspecified: Secondary | ICD-10-CM | POA: Diagnosis not present

## 2022-06-19 DIAGNOSIS — K5100 Ulcerative (chronic) pancolitis without complications: Secondary | ICD-10-CM | POA: Diagnosis not present

## 2022-06-19 DIAGNOSIS — I10 Essential (primary) hypertension: Secondary | ICD-10-CM | POA: Diagnosis not present

## 2022-06-23 DIAGNOSIS — K222 Esophageal obstruction: Secondary | ICD-10-CM | POA: Diagnosis not present

## 2022-06-23 DIAGNOSIS — K219 Gastro-esophageal reflux disease without esophagitis: Secondary | ICD-10-CM | POA: Diagnosis not present

## 2022-06-23 DIAGNOSIS — K5190 Ulcerative colitis, unspecified, without complications: Secondary | ICD-10-CM | POA: Diagnosis not present

## 2022-06-24 DIAGNOSIS — R339 Retention of urine, unspecified: Secondary | ICD-10-CM | POA: Diagnosis not present

## 2022-06-24 DIAGNOSIS — N3010 Interstitial cystitis (chronic) without hematuria: Secondary | ICD-10-CM | POA: Diagnosis not present

## 2022-06-24 DIAGNOSIS — N3281 Overactive bladder: Secondary | ICD-10-CM | POA: Diagnosis not present

## 2022-07-15 DIAGNOSIS — N3010 Interstitial cystitis (chronic) without hematuria: Secondary | ICD-10-CM | POA: Diagnosis not present

## 2022-07-15 DIAGNOSIS — N3281 Overactive bladder: Secondary | ICD-10-CM | POA: Diagnosis not present

## 2022-07-31 DIAGNOSIS — K6389 Other specified diseases of intestine: Secondary | ICD-10-CM | POA: Diagnosis not present

## 2022-07-31 DIAGNOSIS — J45909 Unspecified asthma, uncomplicated: Secondary | ICD-10-CM | POA: Diagnosis not present

## 2022-07-31 DIAGNOSIS — K5190 Ulcerative colitis, unspecified, without complications: Secondary | ICD-10-CM | POA: Diagnosis not present

## 2022-07-31 DIAGNOSIS — K621 Rectal polyp: Secondary | ICD-10-CM | POA: Diagnosis not present

## 2022-07-31 DIAGNOSIS — K6289 Other specified diseases of anus and rectum: Secondary | ICD-10-CM | POA: Diagnosis not present

## 2022-07-31 DIAGNOSIS — Z7951 Long term (current) use of inhaled steroids: Secondary | ICD-10-CM | POA: Diagnosis not present

## 2022-07-31 DIAGNOSIS — I10 Essential (primary) hypertension: Secondary | ICD-10-CM | POA: Diagnosis not present

## 2022-08-04 DIAGNOSIS — K5100 Ulcerative (chronic) pancolitis without complications: Secondary | ICD-10-CM | POA: Diagnosis not present

## 2022-08-04 DIAGNOSIS — N3010 Interstitial cystitis (chronic) without hematuria: Secondary | ICD-10-CM | POA: Diagnosis not present

## 2022-08-04 DIAGNOSIS — R350 Frequency of micturition: Secondary | ICD-10-CM | POA: Diagnosis not present

## 2022-08-04 DIAGNOSIS — N3281 Overactive bladder: Secondary | ICD-10-CM | POA: Diagnosis not present

## 2022-08-04 DIAGNOSIS — J4541 Moderate persistent asthma with (acute) exacerbation: Secondary | ICD-10-CM | POA: Diagnosis not present

## 2022-08-04 DIAGNOSIS — R300 Dysuria: Secondary | ICD-10-CM | POA: Diagnosis not present

## 2022-08-04 DIAGNOSIS — R6883 Chills (without fever): Secondary | ICD-10-CM | POA: Diagnosis not present

## 2022-08-04 DIAGNOSIS — R519 Headache, unspecified: Secondary | ICD-10-CM | POA: Diagnosis not present

## 2022-08-04 DIAGNOSIS — R3914 Feeling of incomplete bladder emptying: Secondary | ICD-10-CM | POA: Diagnosis not present

## 2022-10-05 DIAGNOSIS — J4541 Moderate persistent asthma with (acute) exacerbation: Secondary | ICD-10-CM | POA: Diagnosis not present

## 2022-10-05 DIAGNOSIS — K5100 Ulcerative (chronic) pancolitis without complications: Secondary | ICD-10-CM | POA: Diagnosis not present

## 2022-10-20 DIAGNOSIS — E785 Hyperlipidemia, unspecified: Secondary | ICD-10-CM | POA: Diagnosis not present

## 2022-10-20 DIAGNOSIS — Z131 Encounter for screening for diabetes mellitus: Secondary | ICD-10-CM | POA: Diagnosis not present

## 2022-10-27 DIAGNOSIS — Z6828 Body mass index (BMI) 28.0-28.9, adult: Secondary | ICD-10-CM | POA: Diagnosis not present

## 2022-10-27 DIAGNOSIS — R339 Retention of urine, unspecified: Secondary | ICD-10-CM | POA: Diagnosis not present

## 2022-10-27 DIAGNOSIS — N3001 Acute cystitis with hematuria: Secondary | ICD-10-CM | POA: Diagnosis not present

## 2022-10-27 DIAGNOSIS — N3281 Overactive bladder: Secondary | ICD-10-CM | POA: Diagnosis not present

## 2022-10-27 DIAGNOSIS — J45909 Unspecified asthma, uncomplicated: Secondary | ICD-10-CM | POA: Diagnosis not present

## 2022-10-27 DIAGNOSIS — N390 Urinary tract infection, site not specified: Secondary | ICD-10-CM | POA: Diagnosis not present

## 2022-10-27 DIAGNOSIS — B3731 Acute candidiasis of vulva and vagina: Secondary | ICD-10-CM | POA: Diagnosis not present

## 2022-10-27 DIAGNOSIS — E785 Hyperlipidemia, unspecified: Secondary | ICD-10-CM | POA: Diagnosis not present

## 2022-10-27 DIAGNOSIS — Z23 Encounter for immunization: Secondary | ICD-10-CM | POA: Diagnosis not present

## 2022-10-27 DIAGNOSIS — I10 Essential (primary) hypertension: Secondary | ICD-10-CM | POA: Diagnosis not present

## 2022-11-04 DIAGNOSIS — K5100 Ulcerative (chronic) pancolitis without complications: Secondary | ICD-10-CM | POA: Diagnosis not present

## 2022-11-04 DIAGNOSIS — J4541 Moderate persistent asthma with (acute) exacerbation: Secondary | ICD-10-CM | POA: Diagnosis not present

## 2022-11-12 ENCOUNTER — Other Ambulatory Visit: Payer: Self-pay | Admitting: Family Medicine

## 2022-11-12 DIAGNOSIS — Z1231 Encounter for screening mammogram for malignant neoplasm of breast: Secondary | ICD-10-CM

## 2022-11-19 ENCOUNTER — Ambulatory Visit
Admission: RE | Admit: 2022-11-19 | Discharge: 2022-11-19 | Disposition: A | Discharge status: Home / Self Care | Payer: Medicare PPO | Source: Ambulatory Visit | Attending: Family Medicine | Admitting: Family Medicine

## 2022-11-19 DIAGNOSIS — Z1231 Encounter for screening mammogram for malignant neoplasm of breast: Secondary | ICD-10-CM

## 2022-12-23 DIAGNOSIS — H53032 Strabismic amblyopia, left eye: Secondary | ICD-10-CM | POA: Diagnosis not present

## 2022-12-23 DIAGNOSIS — H353131 Nonexudative age-related macular degeneration, bilateral, early dry stage: Secondary | ICD-10-CM | POA: Diagnosis not present

## 2022-12-23 DIAGNOSIS — H52223 Regular astigmatism, bilateral: Secondary | ICD-10-CM | POA: Diagnosis not present

## 2022-12-23 DIAGNOSIS — H2513 Age-related nuclear cataract, bilateral: Secondary | ICD-10-CM | POA: Diagnosis not present

## 2022-12-23 DIAGNOSIS — H5203 Hypermetropia, bilateral: Secondary | ICD-10-CM | POA: Diagnosis not present

## 2022-12-23 DIAGNOSIS — H524 Presbyopia: Secondary | ICD-10-CM | POA: Diagnosis not present

## 2023-01-06 DIAGNOSIS — E669 Obesity, unspecified: Secondary | ICD-10-CM | POA: Diagnosis not present

## 2023-01-06 DIAGNOSIS — Z136 Encounter for screening for cardiovascular disorders: Secondary | ICD-10-CM | POA: Diagnosis not present

## 2023-01-06 DIAGNOSIS — Z139 Encounter for screening, unspecified: Secondary | ICD-10-CM | POA: Diagnosis not present

## 2023-01-06 DIAGNOSIS — Z1339 Encounter for screening examination for other mental health and behavioral disorders: Secondary | ICD-10-CM | POA: Diagnosis not present

## 2023-01-06 DIAGNOSIS — Z0000 Encounter for general adult medical examination without abnormal findings: Secondary | ICD-10-CM | POA: Diagnosis not present

## 2023-01-06 DIAGNOSIS — Z1331 Encounter for screening for depression: Secondary | ICD-10-CM | POA: Diagnosis not present

## 2023-03-09 DIAGNOSIS — N390 Urinary tract infection, site not specified: Secondary | ICD-10-CM | POA: Diagnosis not present

## 2023-04-29 DIAGNOSIS — R399 Unspecified symptoms and signs involving the genitourinary system: Secondary | ICD-10-CM | POA: Diagnosis not present

## 2023-06-02 DIAGNOSIS — R399 Unspecified symptoms and signs involving the genitourinary system: Secondary | ICD-10-CM | POA: Diagnosis not present

## 2023-06-08 DIAGNOSIS — N390 Urinary tract infection, site not specified: Secondary | ICD-10-CM | POA: Diagnosis not present

## 2023-08-06 DIAGNOSIS — E1169 Type 2 diabetes mellitus with other specified complication: Secondary | ICD-10-CM | POA: Diagnosis not present

## 2023-08-12 DIAGNOSIS — I10 Essential (primary) hypertension: Secondary | ICD-10-CM | POA: Diagnosis not present

## 2023-08-12 DIAGNOSIS — K5100 Ulcerative (chronic) pancolitis without complications: Secondary | ICD-10-CM | POA: Diagnosis not present

## 2023-08-12 DIAGNOSIS — Z1331 Encounter for screening for depression: Secondary | ICD-10-CM | POA: Diagnosis not present

## 2023-08-12 DIAGNOSIS — J45909 Unspecified asthma, uncomplicated: Secondary | ICD-10-CM | POA: Diagnosis not present

## 2023-08-12 DIAGNOSIS — Z6827 Body mass index (BMI) 27.0-27.9, adult: Secondary | ICD-10-CM | POA: Diagnosis not present

## 2023-08-12 DIAGNOSIS — E785 Hyperlipidemia, unspecified: Secondary | ICD-10-CM | POA: Diagnosis not present

## 2023-08-20 DIAGNOSIS — K222 Esophageal obstruction: Secondary | ICD-10-CM | POA: Diagnosis not present

## 2023-08-20 DIAGNOSIS — K219 Gastro-esophageal reflux disease without esophagitis: Secondary | ICD-10-CM | POA: Diagnosis not present

## 2023-08-20 DIAGNOSIS — K5190 Ulcerative colitis, unspecified, without complications: Secondary | ICD-10-CM | POA: Diagnosis not present

## 2023-08-26 DIAGNOSIS — H6192 Disorder of left external ear, unspecified: Secondary | ICD-10-CM | POA: Diagnosis not present

## 2023-08-26 DIAGNOSIS — Z0000 Encounter for general adult medical examination without abnormal findings: Secondary | ICD-10-CM | POA: Diagnosis not present

## 2023-08-26 DIAGNOSIS — Z90710 Acquired absence of both cervix and uterus: Secondary | ICD-10-CM | POA: Diagnosis not present

## 2023-08-26 DIAGNOSIS — Z6827 Body mass index (BMI) 27.0-27.9, adult: Secondary | ICD-10-CM | POA: Diagnosis not present

## 2023-09-21 DIAGNOSIS — Z87440 Personal history of urinary (tract) infections: Secondary | ICD-10-CM | POA: Diagnosis not present

## 2023-09-21 DIAGNOSIS — N3010 Interstitial cystitis (chronic) without hematuria: Secondary | ICD-10-CM | POA: Diagnosis not present

## 2023-10-20 DIAGNOSIS — E785 Hyperlipidemia, unspecified: Secondary | ICD-10-CM | POA: Diagnosis not present

## 2023-10-20 DIAGNOSIS — Z881 Allergy status to other antibiotic agents status: Secondary | ICD-10-CM | POA: Diagnosis not present

## 2023-10-20 DIAGNOSIS — Z79899 Other long term (current) drug therapy: Secondary | ICD-10-CM | POA: Diagnosis not present

## 2023-10-20 DIAGNOSIS — K5150 Left sided colitis without complications: Secondary | ICD-10-CM | POA: Diagnosis not present

## 2023-10-20 DIAGNOSIS — I10 Essential (primary) hypertension: Secondary | ICD-10-CM | POA: Diagnosis not present

## 2023-10-20 DIAGNOSIS — K5100 Ulcerative (chronic) pancolitis without complications: Secondary | ICD-10-CM | POA: Diagnosis not present

## 2023-10-20 DIAGNOSIS — Z860100 Personal history of colon polyps, unspecified: Secondary | ICD-10-CM | POA: Diagnosis not present

## 2023-10-20 DIAGNOSIS — K5190 Ulcerative colitis, unspecified, without complications: Secondary | ICD-10-CM | POA: Diagnosis not present

## 2023-10-20 DIAGNOSIS — Z880 Allergy status to penicillin: Secondary | ICD-10-CM | POA: Diagnosis not present

## 2023-10-20 DIAGNOSIS — J45909 Unspecified asthma, uncomplicated: Secondary | ICD-10-CM | POA: Diagnosis not present

## 2023-10-20 DIAGNOSIS — K219 Gastro-esophageal reflux disease without esophagitis: Secondary | ICD-10-CM | POA: Diagnosis not present

## 2023-12-10 DIAGNOSIS — E2839 Other primary ovarian failure: Secondary | ICD-10-CM | POA: Diagnosis not present

## 2023-12-10 DIAGNOSIS — H353132 Nonexudative age-related macular degeneration, bilateral, intermediate dry stage: Secondary | ICD-10-CM | POA: Diagnosis not present

## 2023-12-10 DIAGNOSIS — M8589 Other specified disorders of bone density and structure, multiple sites: Secondary | ICD-10-CM | POA: Diagnosis not present

## 2023-12-11 DIAGNOSIS — E2839 Other primary ovarian failure: Secondary | ICD-10-CM | POA: Diagnosis not present

## 2023-12-11 DIAGNOSIS — M8589 Other specified disorders of bone density and structure, multiple sites: Secondary | ICD-10-CM | POA: Diagnosis not present

## 2023-12-14 ENCOUNTER — Other Ambulatory Visit: Payer: Self-pay | Admitting: Family Medicine

## 2023-12-14 DIAGNOSIS — Z1231 Encounter for screening mammogram for malignant neoplasm of breast: Secondary | ICD-10-CM

## 2023-12-16 ENCOUNTER — Ambulatory Visit
Admission: RE | Admit: 2023-12-16 | Discharge: 2023-12-16 | Disposition: A | Discharge status: Home / Self Care | Payer: Medicare PPO | Source: Ambulatory Visit | Attending: Family Medicine | Admitting: Family Medicine

## 2023-12-16 DIAGNOSIS — Z1231 Encounter for screening mammogram for malignant neoplasm of breast: Secondary | ICD-10-CM | POA: Diagnosis not present

## 2023-12-21 DIAGNOSIS — N390 Urinary tract infection, site not specified: Secondary | ICD-10-CM | POA: Diagnosis not present

## 2023-12-23 DIAGNOSIS — M1711 Unilateral primary osteoarthritis, right knee: Secondary | ICD-10-CM | POA: Diagnosis not present

## 2023-12-24 DIAGNOSIS — Z23 Encounter for immunization: Secondary | ICD-10-CM | POA: Diagnosis not present
